# Patient Record
Sex: Female | Born: 2000 | Race: Black or African American | Hispanic: No | Marital: Single | Smoking: Never smoker
Health system: Southern US, Community
[De-identification: ages and names within clinical notes are randomized; demographics above are authoritative.]

## PROBLEM LIST (undated history)

## (undated) ENCOUNTER — Ambulatory Visit

## (undated) DIAGNOSIS — T7840XA Allergy, unspecified, initial encounter: Secondary | ICD-10-CM

## (undated) DIAGNOSIS — J45909 Unspecified asthma, uncomplicated: Secondary | ICD-10-CM

## (undated) HISTORY — PX: ADENOIDECTOMY: SUR15

---
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
## 2021-04-30 ENCOUNTER — Encounter (HOSPITAL_COMMUNITY): Payer: Self-pay | Admitting: Emergency Medicine

## 2021-04-30 ENCOUNTER — Other Ambulatory Visit: Payer: Self-pay

## 2021-04-30 ENCOUNTER — Emergency Department (HOSPITAL_COMMUNITY)
Admission: EM | Admit: 2021-04-30 | Discharge: 2021-04-30 | Disposition: A | Discharge status: Home / Self Care | Payer: 59 | Attending: Emergency Medicine | Admitting: Emergency Medicine

## 2021-04-30 DIAGNOSIS — T7805XA Anaphylactic reaction due to tree nuts and seeds, initial encounter: Secondary | ICD-10-CM | POA: Insufficient documentation

## 2021-04-30 DIAGNOSIS — T7840XA Allergy, unspecified, initial encounter: Secondary | ICD-10-CM

## 2021-04-30 MED ORDER — PREDNISONE 10 MG PO TABS: 40.0000 mg | ORAL_TABLET | Freq: Every day | ORAL | 0 refills | Status: DC

## 2021-04-30 MED ORDER — DIPHENHYDRAMINE HCL 50 MG/ML IJ SOLN
50.0000 mg | Freq: Once | INTRAMUSCULAR | Status: AC
  Administered 2021-04-30: 50 mg via INTRAVENOUS
  Filled 2021-04-30: qty 1

## 2021-04-30 MED ORDER — DIPHENHYDRAMINE HCL 25 MG PO TABS: 25.0000 mg | ORAL_TABLET | Freq: Four times a day (QID) | ORAL | 0 refills | Status: AC | PRN

## 2021-04-30 MED ORDER — METHYLPREDNISOLONE SODIUM SUCC 125 MG IJ SOLR
125.0000 mg | Freq: Once | INTRAMUSCULAR | Status: AC
  Administered 2021-04-30: 125 mg via INTRAVENOUS
  Filled 2021-04-30: qty 2

## 2021-04-30 MED ORDER — ONDANSETRON HCL 4 MG/2ML IJ SOLN
4.0000 mg | Freq: Once | INTRAMUSCULAR | Status: AC
  Administered 2021-04-30: 4 mg via INTRAVENOUS
  Filled 2021-04-30: qty 2

## 2021-04-30 MED ORDER — EPINEPHRINE 0.3 MG/0.3ML IJ SOAJ
0.3000 mg | Freq: Once | INTRAMUSCULAR | Status: AC
  Administered 2021-04-30: 0.3 mg via INTRAMUSCULAR
  Filled 2021-04-30: qty 0.3

## 2021-04-30 MED ORDER — FAMOTIDINE IN NACL 20-0.9 MG/50ML-% IV SOLN
20.0000 mg | Freq: Once | INTRAVENOUS | Status: AC
  Administered 2021-04-30: 20 mg via INTRAVENOUS
  Filled 2021-04-30: qty 50

## 2021-04-30 MED ORDER — EPINEPHRINE 0.3 MG/0.3ML IJ SOAJ: 0.3000 mg | INTRAMUSCULAR | 0 refills | Status: AC | PRN

## 2021-04-30 NOTE — ED Provider Notes (Signed)
?[facility]-EMERGENCY DEPT ?Provider Note ? ? ?CSN: [id number] ?Arrival date & time: 04/30/21  2104 ? ?  ? ?History ? ?Chief Complaint  ?Patient presents with  ? Allergic Reaction  ? ? ?Melissa Silva is a 21 y.o. female. ? ?21 year old female with prior medical history as detailed below presents for evaluation.  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  Patient reports that she was eating pecans today.  Within minutes of eating the pecan she began to develop itching and felt like she could not catch her breath. ? ?She came to the ED for treatment. ? ?She had an EpiPen with her --however she had not yet self-administered epinephrine. ? ? ? ?The history is provided by the patient and medical records.  ?Allergic Reaction ?Presenting symptoms: difficulty breathing and itching   ?Severity:  Mild ?Duration:  30 minutes ?Prior allergic episodes:  Food/nut allergies ?Context: food   ?Relieved by:  Nothing ?Worsened by:  Nothing ? ?  ? ?Home Medications ?Prior to Admission medications   ?Medication Sig Start Date End Date Taking? Authorizing Provider  ?albuterol (PROVENTIL HFA;VENTOLIN HFA) 108 (90 Base) MCG/ACT inhaler Inhale 1 puff into the lungs every 6 (six) hours as needed for wheezing or shortness of breath.   Yes [provider]  ?cetirizine (ZYRTEC) 10 MG tablet Take 10 mg by mouth daily.    Yes [provider]  ?diphenhydrAMINE (BENADRYL) 25 MG tablet Take 1 tablet (25 mg total) by mouth every 6 (six) hours as needed for itching. 04/30/21  Yes Wynetta FinesMessick, Rashard Ryle C, MD  ?EPINEPHrine 0.3 mg/0.3 mL IJ SOAJ injection Inject 0.3 mg into the muscle as needed for anaphylaxis. 04/30/21  Yes Wynetta FinesMessick, Johsua Shevlin C, MD  ?predniSONE (DELTASONE) 10 MG tablet Take 4 tablets (40 mg total) by mouth daily. 04/30/21  Yes Wynetta FinesMessick, Perlita Forbush C, MD  ?acetaminophen (TYLENOL) 325 MG tablet Take 2 tablets (650 mg total) by mouth every 6 (six) hours as needed for mild pain or moderate pain. ?Patient not  taking: Reported on 02/23/2019 10/15/17   Sherrilee GillesScoville, Brittany N, NP  ?albuterol (PROVENTIL) (2.5 MG/3ML) 0.083% nebulizer solution Take 3 mLs (2.5 mg total) by nebulization every 4 (four) hours as needed for wheezing or shortness of breath. ?Patient not taking: Reported on 04/30/2021 03/03/16   Niel HummerKuhner, Ross, MD  ?diphenhydrAMINE (BENADRYL) 50 MG capsule Take 1 capsule (50 mg total) by mouth every 6 (six) hours as needed for itching (lip swelling). ?Patient not taking: Reported on 02/23/2019 03/15/18   Florestine AversHanvey, [place], MD  ?EPINEPHrine (EPIPEN 2-PAK) 0.3 mg/0.3 mL IJ SOAJ injection Inject 0.3 mLs (0.3 mg total) into the muscle once as needed for up to 1 dose (for severe allergic reaction). CAll 911 immediately if you have to use this medicine ?Patient not taking: Reported on 04/30/2021 02/23/19   Garlon HatchetSanders, Lisa M, PA-C  ?fluticasone [facility](FLONASE) 50 MCG/ACT nasal spray Place 1 spray into both nostrils as needed for allergies.  ?Patient not taking: Reported on 04/30/2021    [provider]  ?   ? ?Allergies    ?Other, Shellfish allergy, and Tessalon [benzonatate]   ? ?Review of Systems   ?Review of Systems  ?Skin:  Positive for itching.  ?All other systems reviewed and are negative. ? ?Physical Exam ?Updated Vital Signs ?BP 136/71   Pulse 82   Temp 97.9 ?F (36.6 ?C) (Oral)   Resp 17   Ht 5\' 4"  (1.626 m)   Wt 92 kg   SpO2 100%  BMI 34.81 kg/m?  ?Physical Exam ?Vitals and nursing note reviewed.  ?Constitutional:   ?   General: She is not in acute distress. ?   Appearance: Normal appearance. She is well-developed.  ?HENT:  ?   Head: Normocephalic and atraumatic.  ?   Mouth/Throat:  ?   Comments: No oral or lingual edema ?Eyes:  ?   Conjunctiva/sclera: Conjunctivae normal.  ?   Pupils: Pupils are equal, round, and reactive to light.  ?Cardiovascular:  ?   Rate and Rhythm: Normal rate and regular rhythm.  ?   Heart sounds: Normal heart sounds.  ?Pulmonary:  ?   Effort: Pulmonary effort is normal. No respiratory distress.   ?   Breath sounds: Normal breath sounds.  ?Abdominal:  ?   General: There is no distension.  ?   Palpations: Abdomen is soft.  ?   Tenderness: There is no abdominal tenderness.  ?Musculoskeletal:     ?   General: No deformity. Normal range of motion.  ?   Cervical back: Normal range of motion and neck supple.  ?Skin: ?   General: Skin is warm and dry.  ?   Comments: Diffuse urticarial rash noted  ?Neurological:  ?   General: No focal deficit present.  ?   Mental Status: She is alert and oriented to person, place, and time.  ? ? ?ED Results / Procedures / Treatments   ?Labs ?(all labs ordered are listed, but only abnormal results are displayed) ?Labs Reviewed - No data to display ? ?EKG ?None ? ?Radiology ?No results found. ? ?Procedures ?Procedures  ? ? ?Medications Ordered in ED ?Medications  ?diphenhydrAMINE (BENADRYL) injection 50 mg (50 mg Intravenous Given 04/30/21 2114)  ?famotidine (PEPCID) IVPB 20 mg premix (0 mg Intravenous Stopped 04/30/21 2150)  ?methylPREDNISolone sodium succinate (SOLU-MEDROL) 125 mg/2 mL injection 125 mg (125 mg Intravenous Given 04/30/21 2116)  ?EPINEPHrine (EPI-PEN) injection 0.3 mg (0.3 mg Intramuscular Given 04/30/21 2110)  ?ondansetron [facility]) injection 4 mg (4 mg Intravenous Given 04/30/21 2150)  ? ? ?ED Course/ Medical Decision Making/ A&P ?  ?                        ?Medical Decision Making ?Risk ?OTC drugs. ?Prescription drug management. ? ? ? ?Medical Screen Complete ? ?This patient presented to the ED with complaint of allergic reaction. ? ?This complaint involves an extensive number of treatment options. The initial differential diagnosis includes, but is not limited to, allergic reaction ? ?This presentation is: Acute, Chronic, Self-Limited, Previously Undiagnosed, Uncertain Prognosis, Complicated, Systemic Symptoms, and Threat to Life/Bodily Function ? ?Patient with known allergies to tree nuts presents after eating pecans.  Patient with allergic reaction to same. ? ?Patient  given epinephrine, Solu-Medrol, Benadryl and Pepcid upon arrival.  Patient with noted rapid improvement in symptoms. ? ?Patient monitored for the next 3 hours.  She feels significantly improved. ? ?Patient without evidence of other condition. ? ?Patient now desires discharge home. ? ? ? ?Additional history obtained: ? ?External records from outside sources obtained and reviewed including prior ED visits and prior Inpatient records.  ? ? ?Cardiac Monitoring: ? ?The patient was maintained on a cardiac monitor.  I personally viewed and interpreted the cardiac monitor which showed an underlying rhythm of: NSR ? ? ?Medicines ordered: ? ?I ordered medication including Solu-Medrol, Benadryl, Pepcid, epinephrine for allergic reaction ?Reevaluation of the patient after these medicines showed that the patient: improved ? ? ? ?Problem List /  ED Course: ? ?Allergic reaction ? ? ?Reevaluation: ? ?After the interventions noted above, I reevaluated the patient and found that they have: improved ? ?Disposition: ? ?After consideration of the diagnostic results and the patients response to treatment, I feel that the patent would benefit from close outpatient follow-up.  ? ? ?CRITICAL CARE ?Performed by: Wynetta Fines ? ? ?Total critical care time: 30 minutes ? ?Critical care time was exclusive of separately billable procedures and treating other patients. ? ?Critical care was necessary to treat or prevent imminent or life-threatening deterioration. ? ?Critical care was time spent personally by me on the following activities: development of treatment plan with patient and/or surrogate as well as nursing, discussions with consultants, evaluation of patient's response to treatment, examination of patient, obtaining history from patient or surrogate, ordering and performing treatments and interventions, ordering and review of laboratory studies, ordering and review of radiographic studies, pulse oximetry and re-evaluation of patient's  condition. ? ? ? ? ? ? ? ? ?Final Clinical Impression(s) / ED Diagnoses ?Final diagnoses:  ?Allergic reaction, initial encounter  ? ? ?Rx / DC Orders ?ED Discharge Orders   ? ?      Ordered  ?  predniSONE (DEL

## 2021-04-30 NOTE — ED Triage Notes (Signed)
Patient reports tree nut allergy, complains of SOB and itching. Given epi IM 0.3 mg. Ate some pecans.  ?

## 2021-04-30 NOTE — Discharge Instructions (Addendum)
Return for any problem.  ?

## 2022-03-12 ENCOUNTER — Other Ambulatory Visit: Payer: Self-pay

## 2022-03-12 ENCOUNTER — Ambulatory Visit (HOSPITAL_COMMUNITY)
Admission: RE | Admit: 2022-03-12 | Discharge: 2022-03-12 | Disposition: A | Discharge status: Home / Self Care | Payer: Commercial Managed Care - PPO | Source: Ambulatory Visit | Attending: Internal Medicine | Admitting: Internal Medicine

## 2022-03-12 ENCOUNTER — Other Ambulatory Visit (HOSPITAL_COMMUNITY): Payer: Self-pay

## 2022-03-12 ENCOUNTER — Ambulatory Visit (HOSPITAL_COMMUNITY): Payer: Self-pay

## 2022-03-12 ENCOUNTER — Encounter (HOSPITAL_COMMUNITY): Payer: Self-pay

## 2022-03-12 VITALS — BP 119/80 | HR 84 | Temp 98.4°F | Resp 20

## 2022-03-12 DIAGNOSIS — T7840XA Allergy, unspecified, initial encounter: Secondary | ICD-10-CM

## 2022-03-12 MED ORDER — PREDNISONE 20 MG PO TABS
40.0000 mg | ORAL_TABLET | Freq: Every day | ORAL | 0 refills | Status: AC
  Filled 2022-03-12: qty 10, 5d supply, fill #0

## 2022-03-12 MED ORDER — HYDROXYZINE HCL 25 MG PO TABS
25.0000 mg | ORAL_TABLET | Freq: Three times a day (TID) | ORAL | 0 refills | Status: DC | PRN
  Filled 2022-03-12: qty 21, 7d supply, fill #0

## 2022-03-12 NOTE — Discharge Instructions (Signed)
Please take medications as prescribed Please use mild soap and hypoallergenic body lotion to keep your skin moisturized If you have tongue swelling, tightness of your throat, chest tightness or difficulty breathing please return to urgent care immediately to be reevaluated.

## 2022-03-12 NOTE — ED Triage Notes (Addendum)
Fine, raised rash on face, chest a,lips and stomach started one week ago.  Rash itches, burning feeling  Has a shellfish and tree nut allergy, but has not been exposed to either.    Denies any new shampoos, lotions or soaps   Patient has used benadryl   Has not had any difficulty swallowing or breathing

## 2022-03-13 NOTE — ED Provider Notes (Signed)
[place]    CSN: [id number] Arrival date & time: 03/12/22  1352      History   Chief Complaint Chief Complaint  Patient presents with   Appointment    2:00   Rash    HPI Melissa Silva is a 22 y.o. female comes to the urgent care with itching rash over the face lips, upper chest as well as neck.  Patient says symptoms started a week ago and has been persistent.  She denies changing any cosmetics, shampoos, soaps or body lotion.  She recently traveled to [place] but denies any significant dietary changes.  She has tried Benadryl with no improvement in her symptoms.  No shortness of breath or chest tightness.  No wheezing.  No tongue swelling.  No noisy breathing.  HPI  Past Medical History:  Diagnosis Date   Allergy    seasonal   Asthma     Patient Active Problem List   Diagnosis Date Noted   Anaphylaxis 03/13/2018   Anaphylactic reaction 03/13/2018   Insulin resistance 01/07/2016   Primary amenorrhea 01/07/2016   Acanthosis 01/07/2016   Childhood obesity, BMI 95-100 percentile 01/07/2016    Past Surgical History:  Procedure Laterality Date   ADENOIDECTOMY N/A 04/06/2016   Procedure: ADENOIDECTOMY;  Surgeon: Izora Gala, MD;  Location: [facility];  Service: ENT;  Laterality: N/A;   ADENOIDECTOMY      OB History   No obstetric history on file.      Home Medications    Prior to Admission medications   Medication Sig Start Date End Date Taking? Authorizing Provider  hydrOXYzine (ATARAX) 25 MG tablet Take 1 tablet (25 mg total) by mouth every 8 (eight) hours as needed. 03/12/22  Yes Fredi Hurtado, Myrene Galas, MD  predniSONE (DELTASONE) 20 MG tablet Take 2 tablets (40 mg total) by mouth daily for 5 days. 03/12/22 03/18/22 Yes Symphonie Schneiderman, Myrene Galas, MD  acetaminophen (TYLENOL) 325 MG tablet Take 2 tablets (650 mg total) by mouth every 6 (six) hours as needed for mild pain or moderate pain. Patient not taking: Reported on 02/23/2019 10/15/17   Jean Rosenthal, NP  albuterol (PROVENTIL HFA;VENTOLIN HFA) 108 (90 Base) MCG/ACT inhaler Inhale 1 puff into the lungs every 6 (six) hours as needed for wheezing or shortness of breath. Patient not taking: Reported on 03/12/2022    [provider]  albuterol (PROVENTIL) (2.5 MG/3ML) 0.083% nebulizer solution Take 3 mLs (2.5 mg total) by nebulization every 4 (four) hours as needed for wheezing or shortness of breath. Patient not taking: Reported on 04/30/2021 03/03/16   Louanne Skye, MD  cetirizine (ZYRTEC) 10 MG tablet Take 10 mg by mouth daily.     [provider]  diphenhydrAMINE (BENADRYL) 25 MG tablet Take 1 tablet (25 mg total) by mouth every 6 (six) hours as needed for itching. 04/30/21   Valarie Merino, MD  diphenhydrAMINE (BENADRYL) 50 MG capsule Take 1 capsule (50 mg total) by mouth every 6 (six) hours as needed for itching (lip swelling). Patient not taking: Reported on 02/23/2019 03/15/18   Lindwood Qua Niger, MD  EPINEPHrine (EPIPEN 2-PAK) 0.3 mg/0.3 mL IJ SOAJ injection Inject 0.3 mLs (0.3 mg total) into the muscle once as needed for up to 1 dose (for severe allergic reaction). CAll 911 immediately if you have to use this medicine Patient not taking: Reported on 04/30/2021 02/23/19   Larene Pickett, PA-C  EPINEPHrine 0.3 mg/0.3 mL IJ SOAJ injection Inject 0.3 mg into the  muscle as needed for anaphylaxis. 04/30/21   Valarie Merino, MD  fluticasone (FLONASE) 50 MCG/ACT nasal spray Place 1 spray into both nostrils as needed for allergies.  Patient not taking: Reported on 04/30/2021    [provider]    Family History Family History  Problem Relation Age of Onset   Diabetes Mother    Diabetes Father    Cancer Maternal Grandmother    Cancer Maternal Grandfather    Diabetes Paternal Grandmother    Diabetes Paternal Grandfather     Social History Social History   Tobacco Use   Smoking status: Never   Smokeless tobacco: Never  Vaping Use   Vaping Use: Never used   Substance Use Topics   Alcohol use: No   Drug use: Yes    Types: Marijuana     Allergies   Other, Shellfish allergy, and Tessalon [benzonatate]   Review of Systems Review of Systems As per HPI  Physical Exam Triage Vital Signs ED Triage Vitals  Enc Vitals Group     BP 03/12/22 1422 119/80     Pulse Rate 03/12/22 1422 84     Resp 03/12/22 1422 20     Temp 03/12/22 1422 98.4 F (36.9 C)     Temp Source 03/12/22 1422 Oral     SpO2 03/12/22 1422 97 %     Weight --      Height --      Head Circumference --      Peak Flow --      Pain Score 03/12/22 1418 10     Pain Loc --      Pain Edu? --      Excl. in [place]? --    No data found.  Updated Vital Signs BP 119/80 (BP Location: Left Arm)   Pulse 84   Temp 98.4 F (36.9 C) (Oral)   Resp 20   LMP 02/16/2022   SpO2 97%   Visual Acuity Right Eye Distance:   Left Eye Distance:   Bilateral Distance:    Right Eye Near:   Left Eye Near:    Bilateral Near:     Physical Exam Vitals and nursing note reviewed.  HENT:     Mouth/Throat:     Mouth: Mucous membranes are moist.     Comments: No tongue swelling.  Uvula central and fully visible Cardiovascular:     Rate and Rhythm: Normal rate and regular rhythm.  Pulmonary:     Effort: Pulmonary effort is normal.     Breath sounds: Normal breath sounds.  Skin:    General: Skin is warm.     Findings: Rash present.     Comments: Fine papular rash over the face and neck.  Acanthosis nigricans.  Neurological:     Mental Status: She is alert.      UC Treatments / Results  Labs (all labs ordered are listed, but only abnormal results are displayed) Labs Reviewed - No data to display  EKG   Radiology No results found.  Procedures Procedures (including critical care time)  Medications Ordered in UC Medications - No data to display  Initial Impression / Assessment and Plan / UC Course  I have reviewed the triage vital signs and the nursing notes.  Pertinent  labs & imaging results that were available during my care of the patient were reviewed by me and considered in my medical decision making (see chart for details).     1.  Allergic dermatitis: Hydroxyzine 25  mg every 8 hours as needed for itching Prednisone 40 mg orally daily Patient is advised to use mild soap and hypoallergenic body lotion Return precautions given. Final Clinical Impressions(s) / UC Diagnoses   Final diagnoses:  Allergic reaction, initial encounter     Discharge Instructions      Please take medications as prescribed Please use mild soap and hypoallergenic body lotion to keep your skin moisturized If you have tongue swelling, tightness of your throat, chest tightness or difficulty breathing please return to urgent care immediately to be reevaluated.   ED Prescriptions     Medication Sig Dispense Auth. Provider   predniSONE (DELTASONE) 20 MG tablet Take 2 tablets (40 mg total) by mouth daily for 5 days. 10 tablet Shatima Zalar, Myrene Galas, MD   hydrOXYzine (ATARAX) 25 MG tablet Take 1 tablet (25 mg total) by mouth every 8 (eight) hours as needed. 21 tablet Kemia Wendel, Myrene Galas, MD      PDMP not reviewed this encounter.   Chase Picket, MD 03/13/22 2202

## 2022-05-05 DIAGNOSIS — Z111 Encounter for screening for respiratory tuberculosis: Secondary | ICD-10-CM | POA: Diagnosis not present

## 2022-05-15 DIAGNOSIS — Z23 Encounter for immunization: Secondary | ICD-10-CM | POA: Diagnosis not present

## 2022-05-28 ENCOUNTER — Other Ambulatory Visit: Payer: Self-pay

## 2022-05-28 ENCOUNTER — Emergency Department (HOSPITAL_BASED_OUTPATIENT_CLINIC_OR_DEPARTMENT_OTHER)
Admission: EM | Admit: 2022-05-28 | Discharge: 2022-05-29 | Disposition: A | Discharge status: Home / Self Care | Payer: Commercial Managed Care - PPO | Attending: Emergency Medicine | Admitting: Emergency Medicine

## 2022-05-28 ENCOUNTER — Encounter (HOSPITAL_BASED_OUTPATIENT_CLINIC_OR_DEPARTMENT_OTHER): Payer: Self-pay | Admitting: Emergency Medicine

## 2022-05-28 DIAGNOSIS — L309 Dermatitis, unspecified: Secondary | ICD-10-CM | POA: Diagnosis not present

## 2022-05-28 DIAGNOSIS — R21 Rash and other nonspecific skin eruption: Secondary | ICD-10-CM | POA: Diagnosis present

## 2022-05-28 NOTE — ED Triage Notes (Signed)
Facial rash, fine raised bumps on face and neck, dry X 2 months Feels like it is getting worse. Went to UC 3 months ago and given steroids .  "And also I want to make sure it is not lupus"

## 2022-05-29 MED ORDER — HYDROXYZINE HCL 25 MG PO TABS: 25.0000 mg | ORAL_TABLET | Freq: Three times a day (TID) | ORAL | 0 refills | Status: AC | PRN

## 2022-05-29 NOTE — ED Provider Notes (Signed)
   [facility] AT [facility]  Provider Note  CSN: [id number] Arrival date & time: 05/28/22 2313  History Chief Complaint  Patient presents with   Rash    Melissa Silva is a 22 y.o. female with history of allergies and eczema reports an itching rash on her face and neck/chest for the last 2 months. Has seen UC and given oral steroids without much help. She is scheduled to see PCP in about 10 days. She is concerned it might be Lupus.    Home Medications Prior to Admission medications   Medication Sig Start Date End Date Taking? Authorizing Provider  cetirizine (ZYRTEC) 10 MG tablet Take 10 mg by mouth daily.     [provider]  diphenhydrAMINE (BENADRYL) 25 MG tablet Take 1 tablet (25 mg total) by mouth every 6 (six) hours as needed for itching. 04/30/21   Wynetta Fines, MD  EPINEPHrine 0.3 mg/0.3 mL IJ SOAJ injection Inject 0.3 mg into the muscle as needed for anaphylaxis. 04/30/21   Wynetta Fines, MD  hydrOXYzine (ATARAX) 25 MG tablet Take 1 tablet (25 mg total) by mouth every 8 (eight) hours as needed. 05/29/22   Pollyann Savoy, MD     Allergies    Other, Shellfish allergy, and Tessalon [benzonatate]   Review of Systems   Review of Systems Please see HPI for pertinent positives and negatives  Physical Exam BP 104/69   Pulse 87   Temp 98.3 F (36.8 C)   Resp 16   LMP 05/10/2022   SpO2 100%   Physical Exam Vitals and nursing note reviewed.  HENT:     Head: Normocephalic.     Nose: Nose normal.  Eyes:     Extraocular Movements: Extraocular movements intact.  Pulmonary:     Effort: Pulmonary effort is normal.  Musculoskeletal:        General: Normal range of motion.     Cervical back: Neck supple.  Skin:    Findings: No rash (on exposed skin).     Comments: Eczematous rash to face, including cheeks, periobital and perioral areas, Also a smaller area on mid-upper chest/lower neck  Neurological:     Mental Status:  She is alert and oriented to person, place, and time.  Psychiatric:        Mood and Affect: Mood normal.     ED Results / Procedures / Treatments   EKG None  Procedures Procedures  Medications Ordered in the ED Medications - No data to display  Initial Impression and Plan  Patient here with rash most likely eczema, advised she will need to discuss testing for Lupus with her PCP when she sees them on Jun 3. No indication for further ED testing. Offered Vistaril for itching.   ED Course       MDM Rules/Calculators/A&P Medical Decision Making Problems Addressed: Eczema, unspecified type: chronic illness or injury  Risk Prescription drug management.     Final Clinical Impression(s) / ED Diagnoses Final diagnoses:  Eczema, unspecified type    Rx / DC Orders ED Discharge Orders          Ordered    hydrOXYzine (ATARAX) 25 MG tablet  Every 8 hours PRN        05/29/22 0029             Pollyann Savoy, MD 05/29/22 [phone]

## 2022-06-07 NOTE — Patient Instructions (Signed)
It was great to see you! Thank you for allowing me to participate in your care!  We saw you today to establish care. It was great meeting you and hearing your medical history.   Our plans for future:  - Pap Smear  You are due for a pap smear. Make an appointment to be seen  - Sexually transmitted Infection testing (STI)  Make an appointment to be tested for STI's  -Skin issue Make and appointment to discuss your skin  We took a skin scraping to look at your skin. If it shows something I can treat, I will send in a prescription  Take care and seek immediate care sooner if you develop any concerns.   Dr. Bess Kinds, MD [facility]

## 2022-06-07 NOTE — Progress Notes (Unsigned)
  SUBJECTIVE:   CHIEF COMPLAINT / HPI:   PMH P. Surg Hx P. Fam Hx Meds Allergies Social  Work  Programme researcher, broadcasting/film/video  Drugs  Tobacco  EtOH  PERTINENT  PMH / PSH: ***  Past Medical History:  Diagnosis Date   Allergy    seasonal   Asthma     Patient Care Team: Pcp, No as PCP - General Irena Cords, Enzo Montgomery, MD as Consulting Physician (Allergy and Immunology) OBJECTIVE:  LMP 05/10/2022  Physical Exam   ASSESSMENT/PLAN:  There are no diagnoses linked to this encounter. No follow-ups on file. Bess Kinds, MD 06/07/2022, 7:40 PM PGY-***, [facility] {    This will disappear when note is signed, click to select method of visit    :1}

## 2022-06-08 ENCOUNTER — Other Ambulatory Visit: Payer: Self-pay

## 2022-06-08 ENCOUNTER — Ambulatory Visit (INDEPENDENT_AMBULATORY_CARE_PROVIDER_SITE_OTHER): Payer: Commercial Managed Care - PPO | Admitting: Student

## 2022-06-08 ENCOUNTER — Encounter: Payer: Self-pay | Admitting: Student

## 2022-06-08 VITALS — BP 111/55 | HR 94 | Ht 65.0 in | Wt 220.2 lb

## 2022-06-08 DIAGNOSIS — R21 Rash and other nonspecific skin eruption: Secondary | ICD-10-CM

## 2022-06-08 DIAGNOSIS — Z7689 Persons encountering health services in other specified circumstances: Secondary | ICD-10-CM

## 2022-06-10 DIAGNOSIS — Z7689 Persons encountering health services in other specified circumstances: Secondary | ICD-10-CM | POA: Insufficient documentation

## 2022-06-10 DIAGNOSIS — R21 Rash and other nonspecific skin eruption: Secondary | ICD-10-CM | POA: Insufficient documentation

## 2022-06-10 NOTE — Assessment & Plan Note (Signed)
Patient comes in to establish care. Discussed PMH of Asthma, allergies and anxiety, no longer on meds. And discussed f/u appt for pap smear and to discuss rash.

## 2022-06-10 NOTE — Assessment & Plan Note (Signed)
Patient notes rash on face that began 2 months ago. It is very itchy and hypopigmented, located on cheeks and chest. Patient notes no change in skin/hair products or detergents. Patient rash notable for lichenification on chest w/ hyperpigmentation, also located on face. Rash most concerning for eczema, given dry, lichenified skin and pruritus. Also considered yeast, but would expect erythema, and unlikely distrubution/ no hx of immunocompromised. Doesn't appear to be infectious. Will treat w/ mild potency steroid -Betamethasone valerate 0.1%

## 2022-06-11 ENCOUNTER — Other Ambulatory Visit: Payer: Self-pay

## 2022-06-11 ENCOUNTER — Emergency Department (HOSPITAL_COMMUNITY)
Admission: EM | Admit: 2022-06-11 | Discharge: 2022-06-11 | Disposition: A | Discharge status: Home / Self Care | Payer: Commercial Managed Care - PPO | Attending: Emergency Medicine | Admitting: Emergency Medicine

## 2022-06-11 ENCOUNTER — Encounter (HOSPITAL_COMMUNITY): Payer: Self-pay

## 2022-06-11 ENCOUNTER — Emergency Department (HOSPITAL_COMMUNITY): Payer: Commercial Managed Care - PPO

## 2022-06-11 DIAGNOSIS — J45909 Unspecified asthma, uncomplicated: Secondary | ICD-10-CM | POA: Insufficient documentation

## 2022-06-11 DIAGNOSIS — S99912A Unspecified injury of left ankle, initial encounter: Secondary | ICD-10-CM | POA: Diagnosis not present

## 2022-06-11 DIAGNOSIS — W06XXXA Fall from bed, initial encounter: Secondary | ICD-10-CM | POA: Insufficient documentation

## 2022-06-11 DIAGNOSIS — M25572 Pain in left ankle and joints of left foot: Secondary | ICD-10-CM | POA: Diagnosis not present

## 2022-06-11 NOTE — ED Provider Notes (Signed)
[place] EMERGENCY DEPARTMENT AT [facility] Provider Note   CSN: [id number] Arrival date & time: 06/11/22  1251     History  Chief Complaint  Patient presents with   Ankle Pain    Melissa Silva is a 22 y.o. female with history of asthma who presents to the ER complaining of left ankle injury occurring this AM. Patient states she fell out of her bed. No other trauma reported.    Ankle Pain      Home Medications Prior to Admission medications   Medication Sig Start Date End Date Taking? Authorizing Provider  cetirizine (ZYRTEC) 10 MG tablet Take 10 mg by mouth daily.     [provider]  diphenhydrAMINE (BENADRYL) 25 MG tablet Take 1 tablet (25 mg total) by mouth every 6 (six) hours as needed for itching. 04/30/21   Wynetta Fines, MD  EPINEPHrine 0.3 mg/0.3 mL IJ SOAJ injection Inject 0.3 mg into the muscle as needed for anaphylaxis. 04/30/21   Wynetta Fines, MD  hydrOXYzine (ATARAX) 25 MG tablet Take 1 tablet (25 mg total) by mouth every 8 (eight) hours as needed. 05/29/22   Pollyann Savoy, MD      Allergies    Other, Shellfish allergy, and Benzonatate    Review of Systems   Review of Systems  Musculoskeletal:  Positive for arthralgias.  All other systems reviewed and are negative.   Physical Exam Updated Vital Signs BP 138/70 (BP Location: Left Arm)   Pulse 89   Temp 98.4 F (36.9 C) (Oral)   Resp 16   Ht 5\' 5"  (1.651 m)   Wt 99.8 kg   LMP 06/10/2022   SpO2 100%   BMI 36.61 kg/m  Physical Exam Vitals and nursing note reviewed.  Constitutional:      Appearance: Normal appearance.  HENT:     Head: Normocephalic and atraumatic.  Eyes:     Conjunctiva/sclera: Conjunctivae normal.  Cardiovascular:     Pulses:          Posterior tibial pulses are 2+ on the right side and 2+ on the left side.  Pulmonary:     Effort: Pulmonary effort is normal. No respiratory distress.  Feet:     Right foot:     Skin integrity: Skin  integrity normal.     Left foot:     Skin integrity: Skin integrity normal.     Comments: Generalized swelling to the L ankle without bony deformity, slightly decreased ROM due to pain, can range all the digits, normal sensation, normal capillary refill Skin:    General: Skin is warm and dry.  Neurological:     Mental Status: She is alert.  Psychiatric:        Mood and Affect: Mood normal.        Behavior: Behavior normal.     ED Results / Procedures / Treatments   Labs (all labs ordered are listed, but only abnormal results are displayed) Labs Reviewed - No data to display  EKG None  Radiology DG Ankle Complete Left  Result Date: 06/11/2022 CLINICAL DATA:  Left ankle pain, patient fell out of her bed. EXAM: LEFT ANKLE COMPLETE - 3+ VIEW COMPARISON:  Left foot radiographs 11/23/2013 FINDINGS: There is no evidence of fracture, dislocation, or joint effusion. There is no evidence of arthropathy or other focal bone abnormality. Soft tissues are unremarkable. IMPRESSION: Negative. Electronically Signed   By: Emmaline Kluver M.D.   On: 06/11/2022 13:58  Procedures Procedures    Medications Ordered in ED Medications - No data to display  ED Course/ Medical Decision Making/ A&P                             Medical Decision Making Amount and/or Complexity of Data Reviewed Radiology: ordered.   This patient is a 22 y.o. female  who presents to the ED for concern of L ankle injury this AM.   Past Medical History / Co-morbidities / Social History: asthma  Physical Exam: Physical exam performed. The pertinent findings include: Swelling of the L ankle without bony deformity, no breaks in the skin  Lab Tests/Imaging studies: I personally interpreted labs/imaging and the pertinent results include:  xray of the L ankle without abnormality. I agree with the radiologist interpretation.  Disposition: After consideration of the diagnostic results and the patients response to  treatment, I feel that emergency department workup does not suggest an emergent condition requiring admission or immediate intervention beyond what has been performed at this time. The plan is: discharge to home with symptomatic management of likely L ankle sprain. Given ace wrap, recommend OTC meds and RICE method. Given ortho follow up if symptoms persist. The patient is safe for discharge and has been instructed to return immediately for worsening symptoms, change in symptoms or any other concerns.  Final Clinical Impression(s) / ED Diagnoses Final diagnoses:  Injury of left ankle, initial encounter    Rx / DC Orders ED Discharge Orders     None      Portions of this report may have been transcribed using voice recognition software. Every effort was made to ensure accuracy; however, inadvertent computerized transcription errors may be present.    Jeanella Flattery 06/11/22 1453    Cathren Laine, MD 06/13/22 [phone]

## 2022-06-11 NOTE — ED Triage Notes (Signed)
Patient presented to ER for L akle pain, patient states she fell of her bed, patient denies hitting head does not take blood thinners.

## 2022-06-11 NOTE — Discharge Instructions (Addendum)
You were seen in the ER today for an ankle injury.  As we discussed, your x-ray did not show any broken or dislocated bones. I think you likely sprained your ankle. We have wrapped it and I recommend doing this at home too.  You can use ice as needed for swelling, ibuprofen and/or tylenol as needed for pain.  I've attached the contact info for the orthopedist for you to follow up with if your symptoms persist.   Continue to monitor how you're doing and return to the ER for new or worsening symptoms.

## 2022-06-30 ENCOUNTER — Ambulatory Visit: Payer: Commercial Managed Care - PPO | Admitting: Student

## 2022-06-30 NOTE — Progress Notes (Deleted)
  SUBJECTIVE:   CHIEF COMPLAINT / HPI:   STD testing  Pap Smear  Rash -Triamcinilone 0.1%    PERTINENT  PMH / PSH:     Patient Care Team: Bess Kinds, MD as PCP - General (Family Medicine) Irena Cords, Enzo Montgomery, MD as Consulting Physician (Allergy and Immunology) OBJECTIVE:  LMP 06/10/2022  Physical Exam   ASSESSMENT/PLAN:  There are no diagnoses linked to this encounter. No follow-ups on file. Bess Kinds, MD 06/30/2022, 12:22 PM PGY-***, [facility] Family Medicine {    This will disappear when note is signed, click to select method of visit    :1}

## 2022-07-20 ENCOUNTER — Ambulatory Visit: Payer: Commercial Managed Care - PPO | Admitting: Student

## 2022-07-27 ENCOUNTER — Ambulatory Visit: Payer: Commercial Managed Care - PPO | Admitting: Student

## 2022-07-27 NOTE — Progress Notes (Deleted)
  SUBJECTIVE:   CHIEF COMPLAINT / HPI:   STI testing  PAP SMear  PERTINENT  PMH / PSH: ***  Past Medical History:  Diagnosis Date   Allergy    seasonal   Asthma     Patient Care Team: Bess Kinds, MD as PCP - General (Family Medicine) Irena Cords, Enzo Montgomery, MD as Consulting Physician (Allergy and Immunology) OBJECTIVE:  There were no vitals taken for this visit. Physical Exam   ASSESSMENT/PLAN:  There are no diagnoses linked to this encounter. No follow-ups on file. Bess Kinds, MD 07/27/2022, 8:59 AM PGY-***, [facility] Family [facility] {    This will disappear when note is signed, click to select method of visit    :1}

## 2022-08-25 ENCOUNTER — Ambulatory Visit: Payer: Commercial Managed Care - PPO | Admitting: Internal Medicine

## 2022-10-12 ENCOUNTER — Ambulatory Visit: Payer: Commercial Managed Care - PPO | Admitting: Student

## 2022-10-12 NOTE — Progress Notes (Deleted)
  SUBJECTIVE:   CHIEF COMPLAINT / HPI:   Pap Smear   PERTINENT  PMH / PSH: ***  Past Medical History:  Diagnosis Date   Allergy    seasonal   Asthma    OBJECTIVE:  There were no vitals taken for this visit. Physical Exam   ASSESSMENT/PLAN:  There are no diagnoses linked to this encounter. No follow-ups on file. Bess Kinds, MD 10/12/2022, 1:19 PM PGY-***, [facility] {    This will disappear when note is signed, click to select method of visit    :1}

## 2022-12-15 ENCOUNTER — Encounter (HOSPITAL_COMMUNITY): Payer: Self-pay

## 2023-01-26 ENCOUNTER — Encounter (HOSPITAL_COMMUNITY): Payer: Self-pay

## 2023-01-26 ENCOUNTER — Emergency Department (HOSPITAL_COMMUNITY)
Admission: EM | Admit: 2023-01-26 | Discharge: 2023-01-26 | Disposition: A | Discharge status: Home / Self Care | Payer: Commercial Managed Care - PPO | Attending: Emergency Medicine | Admitting: Emergency Medicine

## 2023-01-26 ENCOUNTER — Other Ambulatory Visit: Payer: Self-pay

## 2023-01-26 DIAGNOSIS — Z20822 Contact with and (suspected) exposure to covid-19: Secondary | ICD-10-CM | POA: Insufficient documentation

## 2023-01-26 DIAGNOSIS — J101 Influenza due to other identified influenza virus with other respiratory manifestations: Secondary | ICD-10-CM | POA: Insufficient documentation

## 2023-01-26 DIAGNOSIS — J45909 Unspecified asthma, uncomplicated: Secondary | ICD-10-CM | POA: Diagnosis not present

## 2023-01-26 DIAGNOSIS — R519 Headache, unspecified: Secondary | ICD-10-CM | POA: Diagnosis present

## 2023-01-26 DIAGNOSIS — R000 Tachycardia, unspecified: Secondary | ICD-10-CM | POA: Diagnosis not present

## 2023-01-26 LAB — RESP PANEL BY RT-PCR (RSV, FLU A&B, COVID)  RVPGX2
Influenza A by PCR: POSITIVE — AB
Influenza B by PCR: NEGATIVE
Resp Syncytial Virus by PCR: NEGATIVE
SARS Coronavirus 2 by RT PCR: NEGATIVE

## 2023-01-26 MED ORDER — OSELTAMIVIR PHOSPHATE 75 MG PO CAPS: 75.0000 mg | ORAL_CAPSULE | Freq: Two times a day (BID) | ORAL | 0 refills | Status: DC

## 2023-01-26 MED ORDER — ACETAMINOPHEN 325 MG PO TABS
650.0000 mg | ORAL_TABLET | Freq: Once | ORAL | Status: AC | PRN
  Administered 2023-01-26: 650 mg via ORAL
  Filled 2023-01-26: qty 2

## 2023-01-26 NOTE — ED Triage Notes (Signed)
C/o headache, cough, congestion, bodyachs, and fever x1 day.

## 2023-01-26 NOTE — ED Provider Notes (Signed)
[place] EMERGENCY DEPARTMENT AT [facility] Provider Note   CSN: [id number] Arrival date & time: 01/26/23  1934     History  Chief Complaint  Patient presents with   Fever    Melissa Silva is a 23 y.o. female with history of asthma, who presents to the emergency department complaining of headache, cough, nasal congestion, body aches, and fever since yesterday.  She is also having some central chest pain, mainly with coughing.  No nausea, vomiting, or diarrhea.  She has been taking NyQuil which has helped somewhat with her symptoms.  Reports no known sick contacts.   Fever Associated symptoms: chest pain, congestion, cough, headaches and myalgias        Home Medications Prior to Admission medications   Medication Sig Start Date End Date Taking? Authorizing Provider  oseltamivir (TAMIFLU) 75 MG capsule Take 1 capsule (75 mg total) by mouth every 12 (twelve) hours. 01/26/23  Yes Akeia Perot T, PA-C  cetirizine (ZYRTEC) 10 MG tablet Take 10 mg by mouth daily.     [provider]  diphenhydrAMINE (BENADRYL) 25 MG tablet Take 1 tablet (25 mg total) by mouth every 6 (six) hours as needed for itching. 04/30/21   Wynetta Fines, MD  EPINEPHrine 0.3 mg/0.3 mL IJ SOAJ injection Inject 0.3 mg into the muscle as needed for anaphylaxis. 04/30/21   Wynetta Fines, MD  hydrOXYzine (ATARAX) 25 MG tablet Take 1 tablet (25 mg total) by mouth every 8 (eight) hours as needed. 05/29/22   Pollyann Savoy, MD      Allergies    Other, Shellfish allergy, and Benzonatate    Review of Systems   Review of Systems  Constitutional:  Positive for fever.  HENT:  Positive for congestion.   Respiratory:  Positive for cough.   Cardiovascular:  Positive for chest pain.  Musculoskeletal:  Positive for myalgias.  Neurological:  Positive for headaches.  All other systems reviewed and are negative.   Physical Exam Updated Vital Signs BP (!) 152/79 (BP Location: Left Arm)    Pulse (!) 105   Temp 100.1 F (37.8 C) (Oral)   Resp 18   Ht 5\' 5"  (1.651 m)   Wt 99 kg   LMP 01/24/2023   SpO2 98%   BMI 36.32 kg/m  Physical Exam Vitals and nursing note reviewed.  Constitutional:      Appearance: Normal appearance.  HENT:     Head: Normocephalic and atraumatic.     Nose: Congestion present.  Eyes:     Conjunctiva/sclera: Conjunctivae normal.  Cardiovascular:     Rate and Rhythm: Normal rate and regular rhythm.  Pulmonary:     Effort: Pulmonary effort is normal. No respiratory distress.     Breath sounds: Normal breath sounds.  Abdominal:     General: There is no distension.     Palpations: Abdomen is soft.     Tenderness: There is no abdominal tenderness.  Skin:    General: Skin is warm and dry.  Neurological:     General: No focal deficit present.     Mental Status: She is alert.     ED Results / Procedures / Treatments   Labs (all labs ordered are listed, but only abnormal results are displayed) Labs Reviewed  RESP PANEL BY RT-PCR (RSV, FLU A&B, COVID)  RVPGX2 - Abnormal; Notable for the following components:      Result Value   Influenza A by PCR POSITIVE (*)    All  other components within normal limits    EKG None  Radiology No results found.  Procedures Procedures    Medications Ordered in ED Medications  acetaminophen (TYLENOL) tablet 650 mg (650 mg Oral Given 01/26/23 1947)    ED Course/ Medical Decision Making/ A&P                                 Medical Decision Making Risk OTC drugs. Prescription drug management.   This patient is a 23 y.o. female who presents to the ED for concern of headaches, fever, bodyaches, congestion x 1 day.   Differential diagnoses prior to evaluation: The emergent differential diagnosis includes, but is not limited to,  upper respiratory infection, lower respiratory infection, allergies, asthma, irritants, sinus/esophageal foreign body, medications, reflux, interstitial lung disease,  postnasal drip, viral illness, sepsis. This is not an exhaustive differential.   Past Medical History / Co-morbidities / Additional history: Chart reviewed. Pertinent results include: asthma  Physical Exam: Physical exam performed. The pertinent findings include: Initially febrile given Tylenol, and mildly tachycardic.  Normal respiratory effort, lung sounds clear.  Lab Tests/Imaging studies: I personally interpreted labs/imaging and the pertinent results include:  respiratory panel positive for influenza A.   Cardiac monitoring: EKG obtained and interpreted by my attending physician which shows: Sinus tachycardia   Medications: Tylenol ordered in triage.  I have reviewed the patients home medicines and have made adjustments as needed.   Disposition: After consideration of the diagnostic results and the patients response to treatment, I feel that emergency department workup does not suggest an emergent condition requiring admission or immediate intervention beyond what has been performed at this time. Patient with symptoms consistent with influenza.  Vitals are stable, low-grade fever.  No signs of dehydration, tolerating PO's.  Lungs are clear.   The plan is: Patient will be discharged with instructions to orally hydrate, rest, and use over-the-counter medications such as anti-inflammatories such as ibuprofen and Tylenol for fever.   Prescribed tamiflu. . The patient is safe for discharge and has been instructed to return immediately for worsening symptoms, change in symptoms or any other concerns.  Final Clinical Impression(s) / ED Diagnoses Final diagnoses:  Influenza A    Rx / DC Orders ED Discharge Orders          Ordered    oseltamivir (TAMIFLU) 75 MG capsule  Every 12 hours        01/26/23 2210           Portions of this report may have been transcribed using voice recognition software. Every effort was made to ensure accuracy; however, inadvertent computerized  transcription errors may be present.    Jeanella Flattery 01/26/23 2212    Derwood Kaplan, MD 01/30/23 1148

## 2023-01-26 NOTE — Discharge Instructions (Addendum)
You were seen in the emergency department today for fever, body aches.  As we discussed your influenza A test is positive.  This is a viral illness very common at this time of year, and we normally treat with over-the-counter medications.  Symptoms can last for up to a week.  You can take ibuprofen or Tylenol for pain or fever, and I recommend alternating between the 2.  Make sure that you are drinking lots of fluids and getting plenty of rest. You can take decongestants as long as you take them with lots of water. You can use lozenges or chloraseptic spray as needed for sore throat.   Please use Tylenol or ibuprofen for pain.  You may use 600 mg ibuprofen every 6 hours or 1000 mg of Tylenol every 6 hours.  You may choose to alternate between the 2.  This would be most effective.  Do not exceed 4 g of Tylenol within 24 hours.  Do not exceed 3200 mg ibuprofen within 24 hours.  Continue to monitor how you are doing, and return to the emergency department for new or worsening symptoms such as chest pain, difficulty breathing not related to coughing, fever despite medication, or persistent vomiting or diarrhea.  It has been a pleasure taking care of you today and I hope you begin to feel better soon!

## 2023-01-26 NOTE — ED Notes (Signed)
Nurse was notified of her temp

## 2023-05-28 ENCOUNTER — Ambulatory Visit
Admission: RE | Admit: 2023-05-28 | Discharge: 2023-05-28 | Disposition: A | Discharge status: Home / Self Care | Source: Ambulatory Visit | Attending: Family Medicine | Admitting: Family Medicine

## 2023-05-28 VITALS — BP 123/74 | HR 86 | Temp 98.1°F | Resp 18

## 2023-05-28 DIAGNOSIS — J45901 Unspecified asthma with (acute) exacerbation: Secondary | ICD-10-CM | POA: Diagnosis not present

## 2023-05-28 DIAGNOSIS — J101 Influenza due to other identified influenza virus with other respiratory manifestations: Secondary | ICD-10-CM | POA: Diagnosis not present

## 2023-05-28 LAB — POC COVID19/FLU A&B COMBO
Covid Antigen, POC: NEGATIVE
Influenza A Antigen, POC: POSITIVE — AB
Influenza B Antigen, POC: NEGATIVE

## 2023-05-28 MED ORDER — IPRATROPIUM-ALBUTEROL 0.5-2.5 (3) MG/3ML IN SOLN
3.0000 mL | Freq: Once | RESPIRATORY_TRACT | Status: AC
  Administered 2023-05-28: 3 mL via RESPIRATORY_TRACT

## 2023-05-28 MED ORDER — PREDNISONE 20 MG PO TABS
40.0000 mg | ORAL_TABLET | Freq: Every day | ORAL | 0 refills | Status: AC
Start: 2023-05-28 — End: 2023-06-02

## 2023-05-28 MED ORDER — PROMETHAZINE-DM 6.25-15 MG/5ML PO SYRP: 10.0000 mL | ORAL_SOLUTION | Freq: Four times a day (QID) | ORAL | 0 refills | Status: AC | PRN

## 2023-05-28 MED ORDER — ALBUTEROL SULFATE (2.5 MG/3ML) 0.083% IN NEBU: 2.5000 mg | INHALATION_SOLUTION | RESPIRATORY_TRACT | 0 refills | Status: AC | PRN

## 2023-05-28 MED ORDER — OSELTAMIVIR PHOSPHATE 75 MG PO CAPS: 75.0000 mg | ORAL_CAPSULE | Freq: Two times a day (BID) | ORAL | 0 refills | Status: AC

## 2023-05-28 NOTE — ED Provider Notes (Signed)
 [facility]    CSN: [id number] Arrival date & time: 05/28/23  1743      History   Chief Complaint Chief Complaint  Patient presents with   Cough    Entered by patient    HPI Melissa Silva is a 23 y.o. female.    Melissa Silva is a 23 year old female with a history of asthma presenting with flu-like symptoms and signs of an asthma exacerbation. Her symptoms began on Tuesday with the onset of fever, followed by chest congestion on Wednesday. She reports decreased appetite, ongoing fever, nasal congestion, runny nose, sore throat, chest tightness, productive cough, shortness of breath, wheezing, and headache. She also notes that her body feels sore from persistent coughing and describes episodes of vomiting triggered by severe coughing fits. She has taken Tylenol  and used her rescue inhaler for symptom relief. Although she owns a nebulizer machine, she does not currently have medication for it. She reports a history of having the flu in February. She denies tobacco or vape use and has no known recent sick contacts.  The following portions of the patient's history were reviewed and updated as appropriate: allergies, current medications, past family history, past medical history, past social history, past surgical history, and problem list.    Past Medical History:  Diagnosis Date   Allergy    seasonal   Asthma     Patient Active Problem List   Diagnosis Date Noted   Rash 06/10/2022   Encounter to establish care 06/10/2022   Anaphylaxis 03/13/2018   Anaphylactic reaction 03/13/2018   Insulin resistance 01/07/2016   Primary amenorrhea 01/07/2016   Acanthosis 01/07/2016   Childhood obesity, BMI 95-100 percentile 01/07/2016    Past Surgical History:  Procedure Laterality Date   ADENOIDECTOMY N/A 04/06/2016   Procedure: ADENOIDECTOMY;  Surgeon: Janita Mellow, MD;  Location: [facility];  Service: ENT;  Laterality: N/A;   ADENOIDECTOMY      OB  History   No obstetric history on file.      Home Medications    Prior to Admission medications   Medication Sig Start Date End Date Taking? Authorizing Provider  albuterol  (PROVENTIL ) (2.5 MG/3ML) 0.083% nebulizer solution Take 3 mLs (2.5 mg total) by nebulization every 4 (four) hours as needed for wheezing or shortness of breath. 05/28/23  Yes Maryruth Sol, FNP  oseltamivir  (TAMIFLU ) 75 MG capsule Take 1 capsule (75 mg total) by mouth every 12 (twelve) hours. 05/28/23  Yes Dainel Arcidiacono, FNP  predniSONE  (DELTASONE ) 20 MG tablet Take 2 tablets (40 mg total) by mouth daily with breakfast for 5 days. 05/28/23 06/02/23 Yes Maryruth Sol, FNP  promethazine-dextromethorphan  (PROMETHAZINE-DM) 6.25-15 MG/5ML syrup Take 10 mLs by mouth every 6 (six) hours as needed for cough. 05/28/23  Yes Maryruth Sol, FNP  cetirizine (ZYRTEC) 10 MG tablet Take 10 mg by mouth daily.     [provider]  diphenhydrAMINE  (BENADRYL ) 25 MG tablet Take 1 tablet (25 mg total) by mouth every 6 (six) hours as needed for itching. 04/30/21   Burnette Carte, MD  EPINEPHrine  0.3 mg/0.3 mL IJ SOAJ injection Inject 0.3 mg into the muscle as needed for anaphylaxis. 04/30/21   Burnette Carte, MD  hydrOXYzine  (ATARAX ) 25 MG tablet Take 1 tablet (25 mg total) by mouth every 8 (eight) hours as needed. 05/29/22   Charmayne Cooper, MD    Family History Family History  Problem Relation Age of Onset   Diabetes Mother    Diabetes  Father    Cancer Maternal Grandmother    Cancer Maternal Grandfather    Diabetes Paternal Grandmother    Diabetes Paternal Grandfather     Social History Social History   Tobacco Use   Smoking status: Never   Smokeless tobacco: Never  Vaping Use   Vaping status: Never Used  Substance Use Topics   Alcohol use: No   Drug use: Yes    Types: Marijuana     Allergies   Other, Shellfish allergy, and Benzonatate    Review of Systems Review of Systems  Constitutional:   Positive for appetite change (decreased) and fever.  HENT:  Positive for congestion, rhinorrhea and sore throat.   Respiratory:  Positive for cough (chest congestion; productive cough), shortness of breath and wheezing.   Gastrointestinal:  Positive for vomiting (when coughing really hard).  Musculoskeletal:  Positive for myalgias (from coughing).  Neurological:  Positive for headaches.  All other systems reviewed and are negative.    Physical Exam Triage Vital Signs ED Triage Vitals  Encounter Vitals Group     BP 05/28/23 1952 123/74     Systolic BP Percentile --      Diastolic BP Percentile --      Pulse Rate 05/28/23 1952 86     Resp 05/28/23 1952 18     Temp 05/28/23 1952 98.1 F (36.7 C)     Temp Source 05/28/23 1952 Oral     SpO2 05/28/23 1952 98 %     Weight --      Height --      Head Circumference --      Peak Flow --      Pain Score 05/28/23 1949 0     Pain Loc --      Pain Education --      Exclude from Growth Chart --    No data found.  Updated Vital Signs BP 123/74 (BP Location: Right Arm)   Pulse 86   Temp 98.1 F (36.7 C) (Oral)   Resp 18   LMP 05/17/2023 (Exact Date)   SpO2 98%   Visual Acuity Right Eye Distance:   Left Eye Distance:   Bilateral Distance:    Right Eye Near:   Left Eye Near:    Bilateral Near:     Physical Exam Vitals and nursing note reviewed.  Constitutional:      General: She is awake. She is not in acute distress.    Appearance: Normal appearance. She is well-developed. She is ill-appearing. She is not toxic-appearing or diaphoretic.  HENT:     Head: Normocephalic.     Nose: Congestion present.     Mouth/Throat:     Mouth: Mucous membranes are moist.     Pharynx: Oropharynx is clear. Uvula midline.  Eyes:     General: Vision grossly intact.     Conjunctiva/sclera: Conjunctivae normal.  Cardiovascular:     Rate and Rhythm: Normal rate and regular rhythm.     Heart sounds: Normal heart sounds.  Pulmonary:      Effort: Pulmonary effort is normal. No tachypnea or respiratory distress.     Breath sounds: No decreased air movement. Wheezing (expiratory) present. No decreased breath sounds or rhonchi.  Musculoskeletal:        General: Normal range of motion.     Cervical back: Full passive range of motion without pain, normal range of motion and neck supple.  Lymphadenopathy:     Cervical: No cervical adenopathy.  Skin:  General: Skin is warm and dry.  Neurological:     General: No focal deficit present.     Mental Status: She is alert and oriented to person, place, and time.  Psychiatric:        Behavior: Behavior is cooperative.      UC Treatments / Results  Labs (all labs ordered are listed, but only abnormal results are displayed) Labs Reviewed  POC COVID19/FLU A&B COMBO - Abnormal; Notable for the following components:      Result Value   Influenza A Antigen, POC Positive (*)    All other components within normal limits    EKG   Radiology No results found.  Procedures Procedures (including critical care time)  Medications Ordered in UC Medications  ipratropium-albuterol  (DUONEB) 0.5-2.5 (3) MG/3ML nebulizer solution 3 mL (3 mLs Nebulization Given 05/28/23 2003)    Initial Impression / Assessment and Plan / UC Course  I have reviewed the triage vital signs and the nursing notes.  Pertinent labs & imaging results that were available during my care of the patient were reviewed by me and considered in my medical decision making (see chart for details).     23 year old female with a history of asthma presents with symptoms consistent with an acute asthma exacerbation likely triggered by influenza A infection. On exam, the patient is afebrile, nontoxic, and in no acute respiratory distress, though she does appear acutely ill. Expiratory wheezing is noted on auscultation, but respirations are even and unlabored. Skin is warm and dry, and she is able to speak in full sentences  without difficulty. A DuoNeb treatment was administered in the clinic with some improvement in wheezing noted. Rapid testing returned positive for influenza A, with negative results for influenza B and COVID-19. Given the clinical presentation and test results, her asthma flare is most likely secondary to influenza. Tamiflu  was prescribed to address the viral infection, along with a short course of prednisone  to manage airway inflammation and Promethazine DM for cough suppression. The patient was also provided with nebulized albuterol  to use at home with her machine. Supportive care measures were reviewed, and she was advised on hydration, rest, and continued use of her asthma medications. Clear instructions were given regarding red flag symptoms and indications for emergency department evaluation, and the patient voiced understanding of the treatment plan.  Today's evaluation has revealed no signs of a dangerous process. Discussed diagnosis with patient and/or guardian. Patient and/or guardian aware of their diagnosis, possible red flag symptoms to watch out for and need for close follow up. Patient and/or guardian understands verbal and written discharge instructions. Patient and/or guardian comfortable with plan and disposition.  Patient and/or guardian has a clear mental status at this time, good insight into illness (after discussion and teaching) and has clear judgment to make decisions regarding their care  Documentation was completed with the aid of voice recognition software. Transcription may contain typographical errors. Final Clinical Impressions(s) / UC Diagnoses   Final diagnoses:  Influenza A  Asthma with acute exacerbation, unspecified asthma severity, unspecified whether persistent     Discharge Instructions      You have the flu. Influenza (flu) is a viral infection that mainly affects the respiratory tract. This includes the lungs, nose, and throat. The flu spreads easily from  person to person. Antibiotic medicines are not prescribed for viral infections.This is because antibiotics are designed to kill bacteria. They do not kill viruses. Symptoms of the flu usually begin suddenly and can last 4-14  days.   Take medications as prescribed. You can also take Tylenol  or ibuprofen  if you have a fever, headache, or body aches. Be sure to drink plenty of fluids to stay hydrated.   Seek medical care right away if your symptoms get worse, you have trouble breathing at rest, you notice your lips or fingers turning blue, or you feel confused or very tired.    ED Prescriptions     Medication Sig Dispense Auth. Provider   oseltamivir  (TAMIFLU ) 75 MG capsule Take 1 capsule (75 mg total) by mouth every 12 (twelve) hours. 10 capsule Maryruth Sol, FNP   albuterol  (PROVENTIL ) (2.5 MG/3ML) 0.083% nebulizer solution Take 3 mLs (2.5 mg total) by nebulization every 4 (four) hours as needed for wheezing or shortness of breath. 30 mL Maryruth Sol, FNP   predniSONE  (DELTASONE ) 20 MG tablet Take 2 tablets (40 mg total) by mouth daily with breakfast for 5 days. 10 tablet Maryruth Sol, FNP   promethazine-dextromethorphan  (PROMETHAZINE-DM) 6.25-15 MG/5ML syrup Take 10 mLs by mouth every 6 (six) hours as needed for cough. 118 mL Maryruth Sol, FNP      PDMP not reviewed this encounter.   Maryruth Sol, [place] 05/28/23 2030

## 2023-05-28 NOTE — ED Triage Notes (Signed)
 Pt c/o cough, congestion, wheezing, fever since Tuesday.  Pt has asthma.

## 2023-05-28 NOTE — Discharge Instructions (Addendum)
 You have the flu. Influenza (flu) is a viral infection that mainly affects the respiratory tract. This includes the lungs, nose, and throat. The flu spreads easily from person to person. Antibiotic medicines are not prescribed for viral infections.This is because antibiotics are designed to kill bacteria. They do not kill viruses. Symptoms of the flu usually begin suddenly and can last 4-14 days.   Take medications as prescribed. You can also take Tylenol  or ibuprofen  if you have a fever, headache, or body aches. Be sure to drink plenty of fluids to stay hydrated.   Seek medical care right away if your symptoms get worse, you have trouble breathing at rest, you notice your lips or fingers turning blue, or you feel confused or very tired.

## 2023-06-15 ENCOUNTER — Encounter: Payer: Self-pay | Admitting: *Deleted

## 2023-09-09 DIAGNOSIS — J45909 Unspecified asthma, uncomplicated: Secondary | ICD-10-CM | POA: Diagnosis not present

## 2023-09-09 DIAGNOSIS — B349 Viral infection, unspecified: Secondary | ICD-10-CM | POA: Diagnosis not present

## 2023-09-09 DIAGNOSIS — Z91013 Allergy to seafood: Secondary | ICD-10-CM | POA: Diagnosis not present

## 2023-09-09 DIAGNOSIS — Z20822 Contact with and (suspected) exposure to covid-19: Secondary | ICD-10-CM | POA: Diagnosis not present

## 2023-09-09 DIAGNOSIS — Z91010 Allergy to peanuts: Secondary | ICD-10-CM | POA: Diagnosis not present

## 2023-09-09 DIAGNOSIS — J028 Acute pharyngitis due to other specified organisms: Secondary | ICD-10-CM | POA: Diagnosis not present

## 2023-12-05 ENCOUNTER — Other Ambulatory Visit: Payer: Self-pay

## 2023-12-05 ENCOUNTER — Emergency Department (HOSPITAL_COMMUNITY)

## 2023-12-05 ENCOUNTER — Encounter (HOSPITAL_COMMUNITY): Payer: Self-pay

## 2023-12-05 ENCOUNTER — Emergency Department (HOSPITAL_COMMUNITY): Admission: EM | Admit: 2023-12-05 | Discharge: 2023-12-05 | Disposition: A | Discharge status: Home / Self Care

## 2023-12-05 DIAGNOSIS — R220 Localized swelling, mass and lump, head: Secondary | ICD-10-CM | POA: Diagnosis not present

## 2023-12-05 DIAGNOSIS — S0993XA Unspecified injury of face, initial encounter: Secondary | ICD-10-CM | POA: Diagnosis present

## 2023-12-05 DIAGNOSIS — R519 Headache, unspecified: Secondary | ICD-10-CM | POA: Diagnosis not present

## 2023-12-05 DIAGNOSIS — S0083XA Contusion of other part of head, initial encounter: Secondary | ICD-10-CM | POA: Diagnosis not present

## 2023-12-05 DIAGNOSIS — S0081XA Abrasion of other part of head, initial encounter: Secondary | ICD-10-CM

## 2023-12-05 MED ORDER — IBUPROFEN 800 MG PO TABS
800.0000 mg | ORAL_TABLET | Freq: Once | ORAL | Status: AC
  Administered 2023-12-05: 800 mg via ORAL
  Filled 2023-12-05: qty 1

## 2023-12-05 NOTE — Discharge Instructions (Signed)
 Your imaging is negative for any fracture or bleeding.  You can use Tylenol  and Motrin  as needed for pain.  You can use ice as needed.
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
## 2023-12-05 NOTE — ED Notes (Signed)
 Patient transported to CT

## 2023-12-06 ENCOUNTER — Emergency Department (HOSPITAL_BASED_OUTPATIENT_CLINIC_OR_DEPARTMENT_OTHER)
Admission: EM | Admit: 2023-12-06 | Discharge: 2023-12-06 | Disposition: A | Discharge status: Home / Self Care | Attending: Emergency Medicine | Admitting: Emergency Medicine

## 2023-12-06 ENCOUNTER — Emergency Department (HOSPITAL_BASED_OUTPATIENT_CLINIC_OR_DEPARTMENT_OTHER): Admitting: Radiology

## 2023-12-06 ENCOUNTER — Encounter (HOSPITAL_BASED_OUTPATIENT_CLINIC_OR_DEPARTMENT_OTHER): Payer: Self-pay | Admitting: *Deleted

## 2023-12-06 ENCOUNTER — Other Ambulatory Visit: Payer: Self-pay

## 2023-12-06 DIAGNOSIS — R0789 Other chest pain: Secondary | ICD-10-CM | POA: Diagnosis not present

## 2023-12-06 DIAGNOSIS — H5711 Ocular pain, right eye: Secondary | ICD-10-CM | POA: Insufficient documentation

## 2023-12-06 MED ORDER — KETOROLAC TROMETHAMINE 15 MG/ML IJ SOLN
15.0000 mg | Freq: Once | INTRAMUSCULAR | Status: AC
  Administered 2023-12-06: 15 mg via INTRAMUSCULAR

## 2023-12-06 MED ORDER — KETOROLAC TROMETHAMINE 15 MG/ML IJ SOLN
15.0000 mg | Freq: Once | INTRAMUSCULAR | Status: DC
  Filled 2023-12-06: qty 1

## 2023-12-06 MED ORDER — PREDNISONE 50 MG PO TABS
50.0000 mg | ORAL_TABLET | Freq: Once | ORAL | Status: AC
  Administered 2023-12-06: 50 mg via ORAL
  Filled 2023-12-06: qty 1

## 2023-12-06 MED ORDER — NAPROXEN 500 MG PO TABS: 500.0000 mg | ORAL_TABLET | Freq: Two times a day (BID) | ORAL | 0 refills | Status: AC

## 2023-12-06 MED ORDER — BACITRACIN ZINC 500 UNIT/GM EX OINT: 1.0000 | TOPICAL_OINTMENT | Freq: Two times a day (BID) | CUTANEOUS | 0 refills | Status: AC
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
## 2023-12-06 NOTE — Discharge Instructions (Signed)
 You will be sore for several days.  May take naproxen twice a day as needed for pain.  Do not take extra ibuprofen  at the same time.  Apply ice packs to the eye to help with pain/swelling.  May use bacitracin ointment to the abrasion around the eye twice a day.  Follow-up with PCP in 1 to 2 weeks if no improvement.  Return to ED if any symptoms worsen including uncontrolled headaches, uncontrolled nausea/vomiting, shortness of breath, difficulty breathing.
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
## 2024-01-07 ENCOUNTER — Other Ambulatory Visit (HOSPITAL_COMMUNITY): Payer: Self-pay

## 2024-01-21 ENCOUNTER — Ambulatory Visit: Payer: Self-pay

## 2024-02-11 ENCOUNTER — Encounter: Payer: Self-pay | Admitting: Emergency Medicine

## 2024-02-11 ENCOUNTER — Ambulatory Visit
Admission: EM | Admit: 2024-02-11 | Discharge: 2024-02-11 | Disposition: A | Discharge status: Home / Self Care | Source: Home / Self Care

## 2024-02-11 ENCOUNTER — Ambulatory Visit

## 2024-02-11 DIAGNOSIS — S63602A Unspecified sprain of left thumb, initial encounter: Secondary | ICD-10-CM

## 2024-02-11 MED ORDER — IBUPROFEN 800 MG PO TABS: 800.0000 mg | ORAL_TABLET | Freq: Three times a day (TID) | ORAL | 0 refills | Status: AC | PRN

## 2024-02-11 NOTE — Discharge Instructions (Addendum)
 You were seen today for pain in your left thumb after a fall. Your X-ray did not show any broken bones, which is reassuring. Your symptoms are most consistent with a sprain of the soft tissues around the thumb. A thumb spica splint was placed to help support the thumb and limit movement while it heals. Try to rest the hand and avoid activities that worsen the pain. Apply ice to the thumb for 15-20 minutes at a time several times a day for the first few days to help with swelling. Keep the hand elevated when possible. You may take ibuprofen  as prescribed for pain and inflammation. While taking this medication, do not use over-the-counter anti-inflammatories such as aspirin, Motrin , ibuprofen , or Aleve , as this may increase the risk of side effects. If needed, you may take Tylenol  (acetaminophen ) 1000 mg every six hours for additional pain relief. This equals two 500 mg tablets at a time. Be careful not to take more than 4000 mg of Tylenol  in a 24-hour period.  As the pain improves, you may gradually begin moving and using the thumb again. Follow up with an orthopedic specialist if pain is not improving within one to two weeks, continues to worsen, or if you have trouble using your hand for daily activities. Go to the emergency department right away if you develop severe or increasing pain, significant swelling, numbness or tingling, color changes to the thumb or hand, inability to move the thumb, or signs of infection such as redness spreading, warmth, fever, or drainage. Return to urgent care sooner with any concerns or changes in symptoms.

## 2024-02-11 NOTE — ED Triage Notes (Signed)
 Patient states that she fell last night injuring her left thumb.  Tender to touch and some swelling.  Patient has taken Tylenol  for pain.

## 2024-02-11 NOTE — ED Provider Notes (Signed)
 " [facility]    CSN: [id number] Arrival date & time: 02/11/24  1905      History   Chief Complaint Chief Complaint  Patient presents with   Finger Injury    HPI Melissa Silva is a 24 y.o. female.   Patient presents after a fall last night with pain and swelling involving the left thumb. Reports tenderness to touch with limited range of motion due to pain. Denies numbness, tingling, or weakness of the thumb or hand. Has been using ice and acetaminophen  for symptom relief. Patient is right-hand dominant.  The following sections of the patient's history were reviewed and updated as appropriate: allergies, current medications, past family history, past medical history, past social history, past surgical history, and problem list.       Past Medical History:  Diagnosis Date   Allergy    seasonal   Asthma     Patient Active Problem List   Diagnosis Date Noted   Rash 06/10/2022   Encounter to establish care 06/10/2022   Anaphylaxis 03/13/2018   Anaphylactic reaction 03/13/2018   Insulin resistance 01/07/2016   Primary amenorrhea 01/07/2016   Acanthosis 01/07/2016   Childhood obesity, BMI 95-100 percentile 01/07/2016    Past Surgical History:  Procedure Laterality Date   ADENOIDECTOMY N/A 04/06/2016   Procedure: ADENOIDECTOMY;  Surgeon: Ida Loader, MD;  Location: [facility];  Service: ENT;  Laterality: N/A;   ADENOIDECTOMY      OB History   No obstetric history on file.      Home Medications    Prior to Admission medications  Medication Sig Start Date End Date Taking? Authorizing Provider  albuterol  (PROVENTIL ) (2.5 MG/3ML) 0.083% nebulizer solution Take 3 mLs (2.5 mg total) by nebulization every 4 (four) hours as needed for wheezing or shortness of breath. 05/28/23  Yes Iola Lukes, FNP  cetirizine (ZYRTEC) 10 MG tablet Take 10 mg by mouth daily.    Yes [provider]  diphenhydrAMINE  (BENADRYL ) 25 MG tablet Take 1  tablet (25 mg total) by mouth every 6 (six) hours as needed for itching. 04/30/21  Yes Laurice Maude BROCKS, MD  EPINEPHrine  0.3 mg/0.3 mL IJ SOAJ injection Inject 0.3 mg into the muscle as needed for anaphylaxis. 04/30/21  Yes Laurice Maude BROCKS, MD  hydrOXYzine  (ATARAX ) 25 MG tablet Take 1 tablet (25 mg total) by mouth every 8 (eight) hours as needed. 05/29/22  Yes Roselyn Carlin NOVAK, MD  ibuprofen  (ADVIL ) 800 MG tablet Take 1 tablet (800 mg total) by mouth every 8 (eight) hours as needed (pain). Do not take any additional over-the-counter NSAIDs (such as Advil , Aleve , or other ibuprofen /naproxen  products) while using this medication. 02/11/24  Yes Iola Lukes, FNP  naproxen  (NAPROSYN ) 500 MG tablet Take 1 tablet (500 mg total) by mouth 2 (two) times daily. 12/06/23  Yes Young, Thersia RAMAN, PA-C  oseltamivir  (TAMIFLU ) 75 MG capsule Take 1 capsule (75 mg total) by mouth every 12 (twelve) hours. 05/28/23  Yes Iola Lukes, FNP  promethazine -dextromethorphan  (PROMETHAZINE -DM) 6.25-15 MG/5ML syrup Take 10 mLs by mouth every 6 (six) hours as needed for cough. 05/28/23  Yes Iola Lukes, FNP    Family History Family History  Problem Relation Age of Onset   Diabetes Mother    Diabetes Father    Cancer Maternal Grandmother    Cancer Maternal Grandfather    Diabetes Paternal Grandmother    Diabetes Paternal Grandfather     Social History Social History[1]   Allergies  Other, Shellfish allergy, and Benzonatate    Review of Systems Review of Systems  Musculoskeletal:  Positive for arthralgias and joint swelling.  Skin:  Negative for color change.  Neurological:  Negative for weakness and numbness.  All other systems reviewed and are negative.    Physical Exam Triage Vital Signs ED Triage Vitals  Encounter Vitals Group     BP 02/11/24 1915 137/83     Girls Systolic BP Percentile --      Girls Diastolic BP Percentile --      Boys Systolic BP Percentile --      Boys Diastolic BP  Percentile --      Pulse Rate 02/11/24 1915 (!) 115     Resp 02/11/24 1915 18     Temp 02/11/24 1915 98.8 F (37.1 C)     Temp Source 02/11/24 1915 Oral     SpO2 02/11/24 1915 98 %     Weight 02/11/24 1913 220 lb (99.8 kg)     Height 02/11/24 1913 5' 5 (1.651 m)     Head Circumference --      Peak Flow --      Pain Score 02/11/24 1913 10     Pain Loc --      Pain Education --      Exclude from Growth Chart --    No data found.  Updated Vital Signs BP 137/83 (BP Location: Right Arm)   Pulse (!) 115   Temp 98.8 F (37.1 C) (Oral)   Resp 18   Ht 5' 5 (1.651 m)   Wt 220 lb (99.8 kg)   LMP 01/21/2024   SpO2 98%   BMI 36.61 kg/m   Visual Acuity Right Eye Distance:   Left Eye Distance:   Bilateral Distance:    Right Eye Near:   Left Eye Near:    Bilateral Near:     Physical Exam Vitals reviewed.  Constitutional:      General: She is awake. She is not in acute distress.    Appearance: Normal appearance. She is well-developed. She is not ill-appearing, toxic-appearing or diaphoretic.  HENT:     Head: Normocephalic.     Right Ear: Hearing normal.     Left Ear: Hearing normal.     Nose: Nose normal.     Mouth/Throat:     Mouth: Mucous membranes are moist.  Eyes:     General: Vision grossly intact.     Conjunctiva/sclera: Conjunctivae normal.  Cardiovascular:     Rate and Rhythm: Normal rate and regular rhythm.     Heart sounds: Normal heart sounds.  Pulmonary:     Effort: Pulmonary effort is normal.     Breath sounds: Normal breath sounds and air entry.  Musculoskeletal:        General: Normal range of motion.       Hands:     Cervical back: Normal range of motion and neck supple.     Comments: Left thumb with tenderness to palpation and mild swelling. Range of motion is limited secondary to pain. No gross deformity noted. No bruising noted. Sensation intact. Normal strength. Capillary refill brisk. Distal perfusion intact.   Skin:    General: Skin is warm  and dry.  Neurological:     General: No focal deficit present.     Mental Status: She is alert and oriented to person, place, and time.  Psychiatric:        Speech: Speech normal.  Behavior: Behavior is cooperative.      UC Treatments / Results  Labs (all labs ordered are listed, but only abnormal results are displayed) Labs Reviewed - No data to display  EKG   Radiology DG Hand Complete Left Result Date: 02/11/2024 EXAM: 3 or more VIEW(S) XRAY OF THE LEFT HAND 02/11/2024 07:27:26 PM COMPARISON: None available. CLINICAL HISTORY: Left thumb injury. FINDINGS: BONES AND JOINTS: No acute fracture. No malalignment. SOFT TISSUES: Unremarkable. IMPRESSION: 1. No acute fracture or dislocation. Electronically signed by: Franky Crease MD 02/11/2024 07:29 PM EST RP Workstation: [id number]    Procedures Procedures (including critical care time)  Medications Ordered in UC Medications - No data to display  Initial Impression / Assessment and Plan / UC Course  I have reviewed the triage vital signs and the nursing notes.  Pertinent labs & imaging results that were available during my care of the patient were reviewed by me and considered in my medical decision making (see chart for details).    Patient presents with left thumb pain and swelling following a fall. X-ray of the left thumb is negative for fracture or dislocation. Clinical findings are most consistent with soft tissue injury such as sprain or contusion. Low concern for acute bony injury or neurovascular compromise at this time. A thumb spica splint was applied for support and comfort. Supportive care recommended including rest, ice, compression, and elevation. Ibuprofen  prescribed as needed for pain and inflammation. Advised activity modification and gradual return to normal use as symptoms improve. Orthopedic follow-up recommended if pain persists, worsens, or functional limitation develops.  Today's evaluation has revealed no  signs of a dangerous process. Discussed diagnosis with patient and/or guardian. Patient and/or guardian aware of their diagnosis, possible red flag symptoms to watch out for and need for close follow up. Patient and/or guardian understands verbal and written discharge instructions. Patient and/or guardian comfortable with plan and disposition.  Patient and/or guardian has a clear mental status at this time, good insight into illness (after discussion and teaching) and has clear judgment to make decisions regarding their care  Documentation was completed with the aid of voice recognition software. Transcription may contain typographical errors.  Final Clinical Impressions(s) / UC Diagnoses   Final diagnoses:  Sprain of left thumb, initial encounter     Discharge Instructions      You were seen today for pain in your left thumb after a fall. Your X-ray did not show any broken bones, which is reassuring. Your symptoms are most consistent with a sprain of the soft tissues around the thumb. A thumb spica splint was placed to help support the thumb and limit movement while it heals. Try to rest the hand and avoid activities that worsen the pain. Apply ice to the thumb for 15-20 minutes at a time several times a day for the first few days to help with swelling. Keep the hand elevated when possible. You may take ibuprofen  as prescribed for pain and inflammation. While taking this medication, do not use over-the-counter anti-inflammatories such as aspirin, Motrin , ibuprofen , or Aleve , as this may increase the risk of side effects. If needed, you may take Tylenol  (acetaminophen ) 1000 mg every six hours for additional pain relief. This equals two 500 mg tablets at a time. Be careful not to take more than 4000 mg of Tylenol  in a 24-hour period.  As the pain improves, you may gradually begin moving and using the thumb again. Follow up with an orthopedic specialist if pain is not  improving within one to two weeks,  continues to worsen, or if you have trouble using your hand for daily activities. Go to the emergency department right away if you develop severe or increasing pain, significant swelling, numbness or tingling, color changes to the thumb or hand, inability to move the thumb, or signs of infection such as redness spreading, warmth, fever, or drainage. Return to urgent care sooner with any concerns or changes in symptoms.      ED Prescriptions     Medication Sig Dispense Auth. Provider   ibuprofen  (ADVIL ) 800 MG tablet Take 1 tablet (800 mg total) by mouth every 8 (eight) hours as needed (pain). Do not take any additional over-the-counter NSAIDs (such as Advil , Aleve , or other ibuprofen /naproxen  products) while using this medication. 21 tablet Iola Lukes, FNP      PDMP not reviewed this encounter.    [1]  Social History Tobacco Use   Smoking status: Never   Smokeless tobacco: Never  Vaping Use   Vaping status: Never Used  Substance Use Topics   Alcohol use: No   Drug use: Yes    Types: Marijuana     Iola Lukes, FNP 02/11/24 1959  "
